# Patient Record
Sex: Male | Born: 1980 | Race: White | Hispanic: No | Marital: Single | Smoking: Never smoker
Health system: Southern US, Community
[De-identification: ages and names within clinical notes are randomized; demographics above are authoritative.]

---

## 2007-10-24 ENCOUNTER — Emergency Department (HOSPITAL_COMMUNITY): Admission: EM | Admit: 2007-10-24 | Discharge: 2007-10-24 | Payer: Self-pay | Admitting: Emergency Medicine

## 2008-08-19 ENCOUNTER — Emergency Department (HOSPITAL_COMMUNITY): Admission: EM | Admit: 2008-08-19 | Discharge: 2008-08-19 | Payer: Self-pay | Admitting: Emergency Medicine

## 2014-10-09 ENCOUNTER — Ambulatory Visit (INDEPENDENT_AMBULATORY_CARE_PROVIDER_SITE_OTHER): Payer: BLUE CROSS/BLUE SHIELD

## 2014-10-09 ENCOUNTER — Ambulatory Visit (INDEPENDENT_AMBULATORY_CARE_PROVIDER_SITE_OTHER): Payer: BLUE CROSS/BLUE SHIELD | Admitting: Internal Medicine

## 2014-10-09 VITALS — BP 138/90 | HR 76 | Temp 97.5°F | Resp 16 | Ht 68.0 in | Wt 225.0 lb

## 2014-10-09 DIAGNOSIS — M25572 Pain in left ankle and joints of left foot: Secondary | ICD-10-CM

## 2014-10-09 DIAGNOSIS — S92352A Displaced fracture of fifth metatarsal bone, left foot, initial encounter for closed fracture: Secondary | ICD-10-CM

## 2014-10-09 MED ORDER — MELOXICAM 15 MG PO TABS: 15.0000 mg | ORAL_TABLET | Freq: Every day | ORAL | Status: AC

## 2014-10-09 NOTE — Progress Notes (Signed)
   Subjective:    Patient ID: Steven KraftChristopher D Feinstein, male    DOB: Jan 21, 1981, 34 y.o.   MRN: [id number]  HPI 34 year old gentleman   cc of left foot pain Onset of pain 5 days ago after walking in the park for recreation Pain 4/10, moderate in severity, soreness, radiation up the left foot and the left leg, swelling, pain worse with motion Pain is at the lateral aspect of the left foot, Was wearing sneakers at the time of injury Did not fall or notice traumatic event    Review of Systems  Constitutional: Negative.   Respiratory: Negative.   Cardiovascular: Negative.   Gastrointestinal: Negative.   Genitourinary: Negative.   Musculoskeletal: Positive for arthralgias and gait problem.       Pain swelling of the left foot  Allergic/Immunologic: Negative.   Neurological: Negative.   Hematological: Negative.   Psychiatric/Behavioral: Negative.   All other systems reviewed and are negative.      Objective:   Physical Exam  Constitutional: He is oriented to person, place, and time. He appears well-developed.  HENT:  Head: Normocephalic and atraumatic.  Right Ear: External ear normal.  Left Ear: External ear normal.  Eyes: Conjunctivae and EOM are normal. Pupils are equal, round, and reactive to light.  Neck: Normal range of motion. Neck supple.  Cardiovascular: Normal rate, regular rhythm and normal heart sounds.   Pulmonary/Chest: Effort normal and breath sounds normal.  Abdominal: Soft. Bowel sounds are normal.  Musculoskeletal: He exhibits tenderness.  Pain and tenderness and sort tissue swelling of the 5th metatarsal left foot  Neurological: He is alert and oriented to person, place, and time.  Skin: Skin is warm and dry.  Psychiatric: He has a normal mood and affect. His behavior is normal. Judgment and thought content normal.  Nursing note and vitals reviewed.    [facility] reading (PRIMARY) by  Dr. Gwendolyn FillGottlieb  Xray left foot shows non displaced hairline fracture at the base  of hte 5th metatarsal one one view.      Assessment & Plan:  34 year old with pain and swelling of the 5ht lmetatarsal of hte left foot after walking in the park oabout 5 days ago 1. Foot pain Xray reveals nondisplaced incomplete fracture of the base of the 5th metatarsal of the left foor. Pt placed in a post of shoe Neurovascular checked and is normal after the application of the shoe Rx for nsai given . Instructed to wear shoe for 3 weeks and f/up in 2 weeks.

## 2014-10-09 NOTE — Patient Instructions (Signed)
Wear the post op shoe for 4 weeks. Return for followup in 2 weeks i fno improvement or if increased pain. meloxicam as directed if needed for pain.

## 2015-03-20 ENCOUNTER — Ambulatory Visit (INDEPENDENT_AMBULATORY_CARE_PROVIDER_SITE_OTHER): Payer: BLUE CROSS/BLUE SHIELD | Admitting: Physician Assistant

## 2015-03-20 ENCOUNTER — Ambulatory Visit (INDEPENDENT_AMBULATORY_CARE_PROVIDER_SITE_OTHER): Payer: BLUE CROSS/BLUE SHIELD

## 2015-03-20 VITALS — BP 120/86 | HR 83 | Temp 98.8°F | Resp 16 | Ht 68.0 in | Wt 217.6 lb

## 2015-03-20 DIAGNOSIS — R053 Chronic cough: Secondary | ICD-10-CM

## 2015-03-20 DIAGNOSIS — R05 Cough: Secondary | ICD-10-CM | POA: Diagnosis not present

## 2015-03-20 MED ORDER — BENZONATATE 100 MG PO CAPS: 100.0000 mg | ORAL_CAPSULE | Freq: Three times a day (TID) | ORAL | Status: AC | PRN

## 2015-03-20 MED ORDER — HYDROCOD POLST-CPM POLST ER 10-8 MG/5ML PO SUER: 5.0000 mL | Freq: Every evening | ORAL | Status: AC | PRN

## 2015-03-20 NOTE — Progress Notes (Signed)
   Subjective:    Patient ID: Steven Little, male    DOB: 11-12-1980, 34 y.o.   MRN: [id number]  HPI Patient presents for cough that has been present for past 3 weeks that has gotten progressively worse. Taking a normal breath result in coughing fit. Cough is bad throughout the day, but worse when laying down. Usually non-productive, but if it is white sputum produced. Coughing has caused emesis. Endorses congestion and rhinorrhea. Denies sinus pressure, fever, SOB/CP, wheezing, or N/V. No h/o smoking, asthma, allergies, CHF, or HTN. No recent illness. Taking cough drops with only marginally relief. NKDA.   Review of Systems  Constitutional: Positive for diaphoresis. Negative for fever, chills and fatigue.  HENT: Positive for congestion and rhinorrhea. Negative for ear pain, postnasal drip, sinus pressure, sneezing and sore throat.   Respiratory: Positive for cough. Negative for choking, chest tightness, shortness of breath and wheezing.   Cardiovascular: Negative for chest pain and leg swelling.  Gastrointestinal: Negative for nausea and vomiting.  Neurological: Negative for dizziness and headaches.       Objective:   Physical Exam  Constitutional: He is oriented to person, place, and time. He appears well-developed and well-nourished. No distress.  Blood pressure 120/86, pulse 83, temperature 98.8 F (37.1 C), temperature source Oral, resp. rate 16, height  (1.727 m), weight 217 lb 9.6 oz (98.703 kg), SpO2 99 %.  HENT:  Head: Normocephalic and atraumatic.  Right Ear: Tympanic membrane, external ear and ear canal normal.  Left Ear: Tympanic membrane, external ear and ear canal normal.  Nose: Rhinorrhea (with mild erythema) present. No mucosal edema. Right sinus exhibits no maxillary sinus tenderness and no frontal sinus tenderness. Left sinus exhibits no maxillary sinus tenderness and no frontal sinus tenderness.  Mouth/Throat: Uvula is midline, oropharynx is clear and moist  and mucous membranes are normal. No oropharyngeal exudate, posterior oropharyngeal edema or posterior oropharyngeal erythema.  Eyes: Conjunctivae and EOM are normal. Pupils are equal, round, and reactive to light. Right eye exhibits no discharge. Left eye exhibits no discharge. No scleral icterus.  Neck: Normal range of motion. Neck supple. No thyromegaly present.  Cardiovascular: Normal rate, regular rhythm and normal heart sounds.  Exam reveals no gallop and no friction rub.   No murmur heard. Pulmonary/Chest: Effort normal and breath sounds normal. No respiratory distress. He has no wheezes. He has no rales. He exhibits no tenderness.  Abdominal: Soft. Bowel sounds are normal. He exhibits no distension and no mass. There is no tenderness. There is no rebound and no guarding.  Lymphadenopathy:    He has no cervical adenopathy.  Neurological: He is alert and oriented to person, place, and time.  Skin: Skin is warm and dry. No rash noted. He is not diaphoretic. No erythema. No pallor.   [facility] reading (PRIMARY) by  Dr. Alwyn Ren. No acute cardiopulmonary findings.    Assessment & Plan:  1. Persistent cough for 3 weeks or longer - DG Chest 2 View; Future - benzonatate (TESSALON) 100 MG capsule; Take 1-2 capsules (100-200 mg total) by mouth 3 (three) times daily as needed for cough.  Dispense: 40 capsule; Refill: 0 - chlorpheniramine-HYDROcodone (TUSSIONEX PENNKINETIC ER) 10-8 MG/5ML SUER; Take 5 mLs by mouth at bedtime as needed for cough.  Dispense: 75 mL; Refill: 0   Carmella Kees PA-C  [facility] 03/20/2015 10:20 AM

## 2015-03-20 NOTE — Patient Instructions (Signed)
Cough, Adult  A cough is a reflex that helps clear your throat and airways. It can help heal the body or may be a reaction to an irritated airway. A cough may only last 2 or 3 weeks (acute) or may last more than 8 weeks (chronic).  CAUSES Acute cough:  Viral or bacterial infections. Chronic cough:  Infections.  Allergies.  Asthma.  Post-nasal drip.  Smoking.  Heartburn or acid reflux.  Some medicines.  Chronic lung problems (COPD).  Cancer. SYMPTOMS   Cough.  Fever.  Chest pain.  Increased breathing rate.  High-pitched whistling sound when breathing (wheezing).  Colored mucus that you cough up (sputum). TREATMENT   A bacterial cough may be treated with antibiotic medicine.  A viral cough must run its course and will not respond to antibiotics.  Your caregiver may recommend other treatments if you have a chronic cough. HOME CARE INSTRUCTIONS   Only take over-the-counter or prescription medicines for pain, discomfort, or fever as directed by your caregiver. Use cough suppressants only as directed by your caregiver.  Use a cold steam vaporizer or humidifier in your bedroom or home to help loosen secretions.  Sleep in a semi-upright position if your cough is worse at night.  Rest as needed.  Stop smoking if you smoke. SEEK IMMEDIATE MEDICAL CARE IF:   You have pus in your sputum.  Your cough starts to worsen.  You cannot control your cough with suppressants and are losing sleep.  You begin coughing up blood.  You have difficulty breathing.  You develop pain which is getting worse or is uncontrolled with medicine.  You have a fever. MAKE SURE YOU:   Understand these instructions.  Will watch your condition.  Will get help right away if you are not doing well or get worse. Document Released: 02/10/2011 Document Revised: 11/06/2011 Document Reviewed: 02/10/2011 [employer] Patient Information 2015 [employer]. This information is not intended  to replace advice given to you by your health care provider. Make sure you discuss any questions you have with your health care provider.  

## 2016-09-23 IMAGING — CR DG FOOT COMPLETE 3+V*L*
3 series · 3 of 3 positions shown · non-contrast
Comparison: None.

CLINICAL DATA: Post fall 5 days ago now with pain involving the
lateral aspect of the foot. Initial encounter.

EXAM:
LEFT FOOT - COMPLETE 3+ VIEW

[AP]
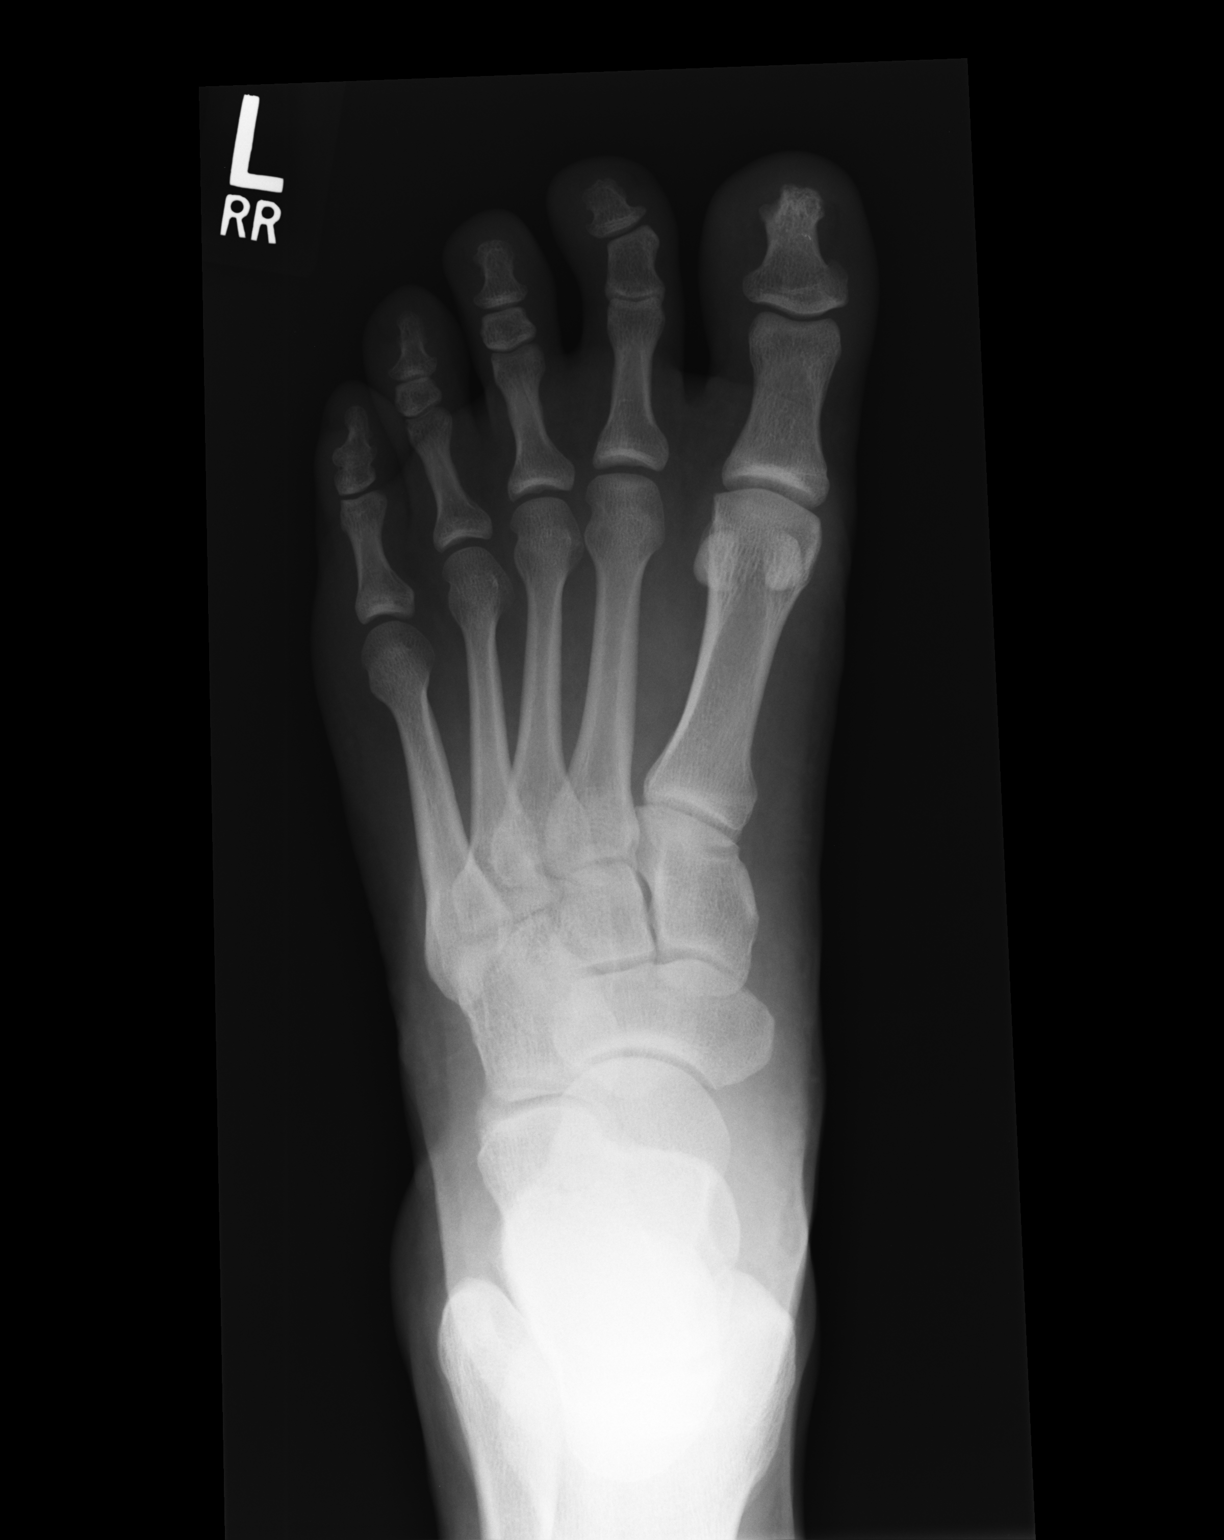

[ap obl int rot]
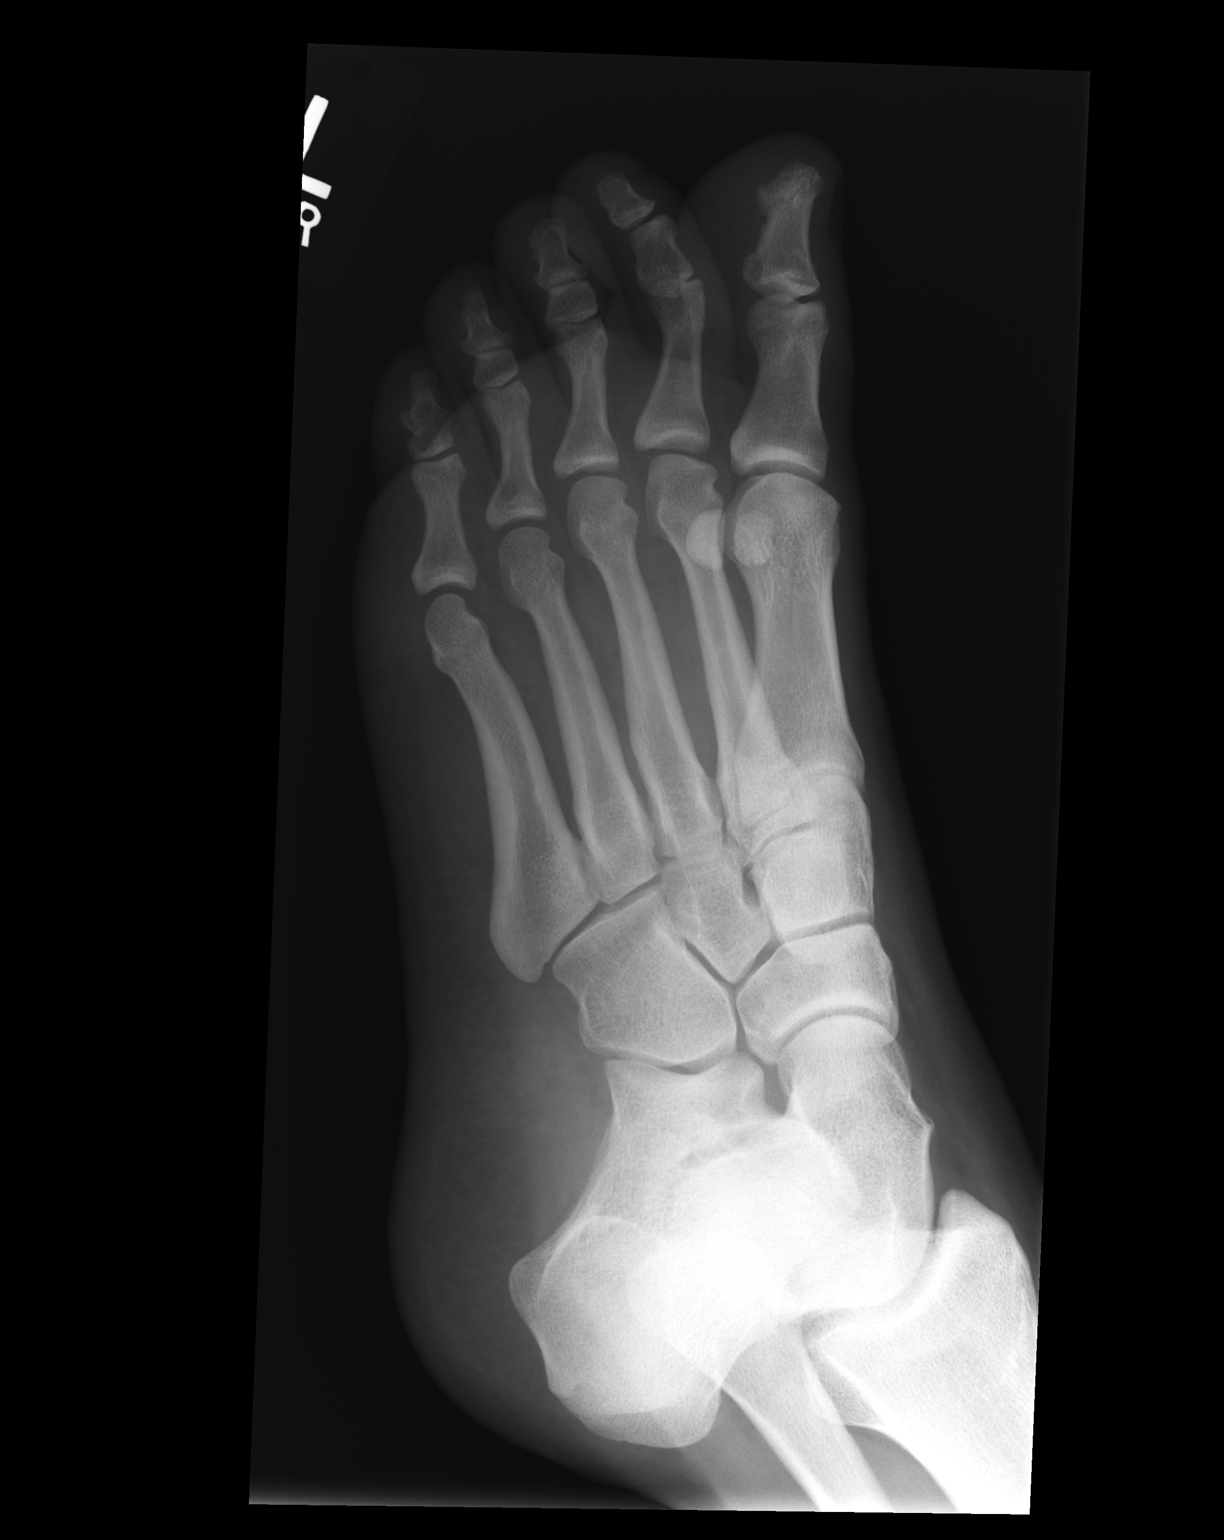

[lateral]
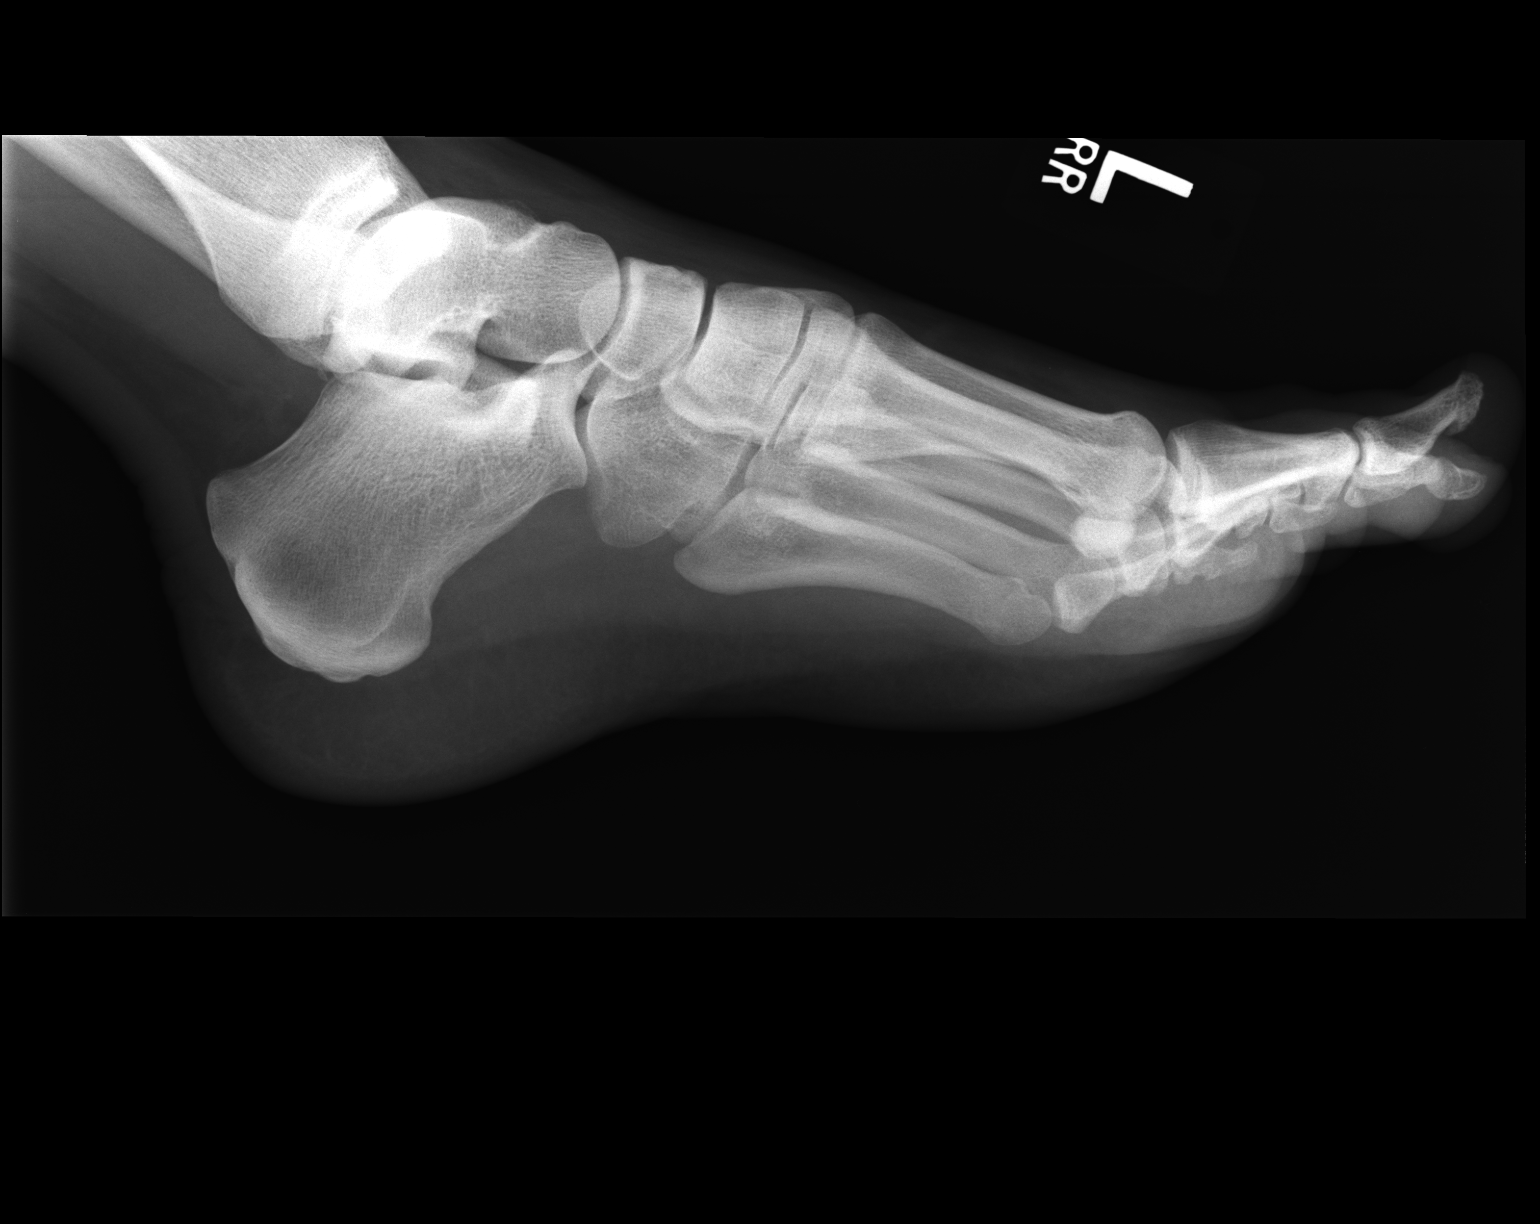

[3 of 3 positions shown; findings below may reference images not displayed]

FINDINGS: No fracture or dislocation. Joint spaces are preserved. No erosions.
Regional soft tissues appear normal. No radiopaque foreign body.
IMPRESSION: No acute findings.
